# Patient Record
Sex: Female | Born: 1970 | Race: White | Hispanic: No | Marital: Married | Smoking: Never smoker
Health system: Southern US, Community
[De-identification: ages and names within clinical notes are randomized; demographics above are authoritative.]

## PROBLEM LIST (undated history)

## (undated) DIAGNOSIS — D649 Anemia, unspecified: Secondary | ICD-10-CM

## (undated) HISTORY — PX: WISDOM TOOTH EXTRACTION: SHX21

## (undated) HISTORY — DX: Anemia, unspecified: D64.9

---

## 2008-02-22 ENCOUNTER — Ambulatory Visit: Payer: Self-pay | Admitting: Obstetrics & Gynecology

## 2008-04-10 ENCOUNTER — Encounter: Payer: Self-pay | Admitting: Obstetrics & Gynecology

## 2008-04-10 ENCOUNTER — Ambulatory Visit: Payer: Self-pay | Admitting: Obstetrics & Gynecology

## 2008-05-21 ENCOUNTER — Ambulatory Visit: Payer: Self-pay | Admitting: Obstetrics & Gynecology

## 2011-01-26 NOTE — Assessment & Plan Note (Signed)
Melissa Shields, Melissa Shields                 ACCOUNT NO.:  [id number]   MEDICAL RECORD NO.:  [id number]          PATIENT TYPE:  POB   LOCATION:  [facility] at [place]         FACILITY:  [facility]   PHYSICIAN:  Allie Bossier, MD        DATE OF BIRTH:  1971/05/18   DATE OF SERVICE:                                  CLINIC NOTE   Melissa Shields is a 40 year old separated white gravida 0 who was a patient of mine  in [place] and is subsequently seeing me at the [facility].  She is now going to the [facility] due to its location (closer  to [place] where she lives).  She is here for her annual exam  today.  Her last Pap smear was approximately 2 years ago in [facility]  she has always had normal Pap smears.  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  She would like to be pregnant and in fact  done a day-3 [facility]; the results will be available soon.   PAST SURGICAL HISTORY:  Wisdom teeth extraction.   FAMILY HISTORY:  Negative for breast, GYN, and colon malignancies, but  positive for coronary artery disease.   No LATEX allergies.  Her only drug allergy is an allergy medicine that  she received as a child.   SOCIAL HISTORY:  Negative for tobacco, alcohol, or drug use.   REVIEW OF SYSTEMS:  Separated and abstinent for more than a year.  She  has only been able to achieve intercourse with her husband during their  marriage on very few occasions; this was done after extensive work with  vaginal dilators.  She works for the [employer]   MEDICATIONS:  She takes multivitamin (with folic acid) when she  remembers it (she notes that this is ideal prior to conception).   PHYSICAL EXAMINATION:  VITAL SIGNS:  Weight 117 pounds, height 5 feet 3-  1/2 inches, blood pressure 110/60, and pulse 68.  HEENT:  Normal.  HEART:  Regular rate and rhythm.  LUNGS:  Clear to auscultation bilaterally.  BREAST EXAM:  Normal bilaterally.  ABDOMEN:  Scaphoid.  No hepatosplenomegaly.  GU:  External genitalia, no lesions.  With great care, a Peterson  speculum was used to visualize the cervix.  It is nulliparous with no  lesions and normal discharge.  Pap smear with cultures was obtained.  Bimanual exam with one-finger has determined that her uterus is  anteverted and mobile and her adnexa are not enlarged.   ASSESSMENT AND PLAN:  1. Annual exam.  Checked a Pap smear.  She will get her routine annual      mammogram starting next year.  I have recommended self-breast and      self-vulvar exams monthly.  2. In her desire for pregnancy, I have recommended multivitamin with      folic acid daily.  I am awaiting on the day-3 [facility] results and she      will have to work out the paternity state of the future pregnancy.  3. Family history of  heart disease and no recent screening of lipids.      We will check her lipids today.  She is not fasting, but only had a      small bowl of cereal.      Allie Bossier, MD     MCD/MEDQ  D:  04/10/2008  T:  04/11/2008  Job:  [id number]

## 2011-01-26 NOTE — Assessment & Plan Note (Signed)
NAMEJULIENNE, Melissa Shields                 ACCOUNT NO.:  [id number]   MEDICAL RECORD NO.:  [id number]          PATIENT TYPE:  POB   LOCATION:  [facility]         FACILITY:  [facility]   PHYSICIAN:  Allie Bossier, MD        DATE OF BIRTH:  1971/03/21   DATE OF SERVICE:  02/22/2008                                  CLINIC NOTE   Melissa Shields is a 40 year old separated white gravida 0.  She was a patient of  mine in [place], [place], where I saw her for severe  vaginismus.  In 2007, we were working on helping her to be able to have  intercourse with her husband.  Over the course of several months using  vaginal dilators and Elavil, she was eventually able to have intercourse  one time, but since that time her husband has been experiencing some  intermittent difficulties with impotence, and even with a counseling,  they have been going through a separation since November.  She reports  having difficulty sleeping, her family doctor, Dr. Valerie Shields had given  her some sleeping aids, but he has left [place] and has moved back  to [place].  Her main complaint today is that of difficulty getting  sleep.  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  Her other complaint is that she is fearful on  her decreasing fertility, she has never been pregnant and does desire to  be pregnant at some point.  She would like an idea of what her fertility  is like at this point.  She is willing to see a Reproductive  Endocrinologist as necessary.   PAST MEDICAL HISTORY:  1. Seasonal allergies.  2. Vaginismus.   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  Her Pap smear and mammogram were  both normal in 2007.   FAMILY HISTORY:  Negative for breast, GYN, and colon malignancies.   SOCIAL HISTORY:  She reports rare alcohol use.  She denies tobacco or  drug use.  No latex allergies, no known drug  allergies.   PHYSICAL EXAM:  Weight 116 pounds, height 5 feet 3 inches, blood  pressure 133/70, and pulse 79.   Our discussion ended with me giving her prescription for Elavil 25 mg  p.o. at bedtime to be refilled as necessary and one-time prescription of  #40 Ativan at 1 mg to be used on a p.r.n. basis only.  She understands,  will not be given refills for this medicine.  She will schedule an  annual exam in the [place] office in 2-3 weeks and at that time,  will let me know how her sleeping is doing.  I have recommended  counseling.  With regards to her desire for pregnancy, I will check a  day-3 [facility] and have this blood test drawn in [facility].  She will go  over the results with me in the [place] office and I will refer her  to Dr. __________as necessary.     Allie Bossier, MD  MCD/MEDQ  D:  02/22/2008  T:  02/23/2008  Job:  [id number]

## 2014-03-13 ENCOUNTER — Telehealth: Payer: Self-pay | Admitting: *Deleted

## 2014-03-13 NOTE — Telephone Encounter (Signed)
Will route note to Dr. Marice Potterove for her recommendation. Patient not seen since 2009. She was a patient of Dr. Norman Clayoves from [place].

## 2014-03-13 NOTE — Telephone Encounter (Signed)
Patient left message that she is a patient of Dr. Norman Clayoves. She states that she is having pain in her breast and would like to be referred for a mammogram. She can be reached at [phone].

## 2014-03-21 ENCOUNTER — Telehealth: Payer: Self-pay | Admitting: General Practice

## 2014-03-21 NOTE — Telephone Encounter (Signed)
Patient called and left message stating she is a patient of Dr Ellin Sabaove's and wanted an appt for a mammogram but called and had one set up herself and it is scheduled for tomorrow 7/10 and she would like those results when they come back. Called patient and stated she may call us back on Monday for those results. Patient verbalized understanding and stated after that she would like to set up an appt at the [facility] for her annual exam. Gave patient [place] office phone number and told her to go ahead and call them to set up an appt. Patient verbalized understanding and had no other questions

## 2014-03-22 NOTE — Telephone Encounter (Signed)
Message copied by Mannie StabileASH, AMANDA A on Fri Mar 22, 2014 11:32 AM ------      Message from: Allie BossierVE, MYRA C      Created: Fri Mar 22, 2014 10:44 AM       Both please. She can be seen in [place] and have her mammogram there or at [facility] if this is more convenient.      Thanks      ----- Message -----         From: Faith RogueAmanda Andrews Rash, LPN         Sent: 03/20/2014  12:56 PM           To: Allie BossierMyra C Dove, MD            Dr. Marice Potterove,             This is a patient of yours from [facility]. She is requesting that you order a mammogram because she is having pain in her breast. You have not seen her since 2009. Do you want to schedule her for an annual exam or just a mammogram?            Thanks,             Marchelle FolksAmanda       ------

## 2014-03-22 NOTE — Telephone Encounter (Signed)
Will route note to [place] office.

## 2016-03-30 ENCOUNTER — Encounter: Payer: Self-pay | Admitting: Obstetrics & Gynecology

## 2016-04-20 ENCOUNTER — Encounter: Payer: Self-pay | Admitting: Obstetrics & Gynecology

## 2019-02-13 ENCOUNTER — Telehealth: Payer: Self-pay | Admitting: *Deleted

## 2019-02-13 NOTE — Telephone Encounter (Signed)
Left patient a message to call the office prior to appointment on 02/15/2019 to answer screening questions.

## 2019-02-15 ENCOUNTER — Other Ambulatory Visit: Payer: Self-pay

## 2019-02-15 ENCOUNTER — Encounter: Payer: Self-pay | Admitting: Obstetrics & Gynecology

## 2019-02-15 ENCOUNTER — Ambulatory Visit: Payer: BLUE CROSS/BLUE SHIELD | Admitting: Obstetrics & Gynecology

## 2019-02-15 VITALS — BP 108/62 | HR 92 | Resp 16 | Ht 63.0 in | Wt 135.0 lb

## 2019-02-15 DIAGNOSIS — Z1151 Encounter for screening for human papillomavirus (HPV): Secondary | ICD-10-CM | POA: Diagnosis not present

## 2019-02-15 DIAGNOSIS — Z01419 Encounter for gynecological examination (general) (routine) without abnormal findings: Secondary | ICD-10-CM | POA: Diagnosis not present

## 2019-02-15 DIAGNOSIS — N920 Excessive and frequent menstruation with regular cycle: Secondary | ICD-10-CM

## 2019-02-15 DIAGNOSIS — Z124 Encounter for screening for malignant neoplasm of cervix: Secondary | ICD-10-CM | POA: Diagnosis not present

## 2019-02-15 NOTE — Progress Notes (Signed)
Subjective:    Melissa Shields is a 48 y.o. divorced P0  female who presents for an annual exam. The patient has no complaints today. Her periods are heavy, she was diagnosed with anemia and is on iron. The patient is not currently sexually active. GYN screening history: last pap: was normal. The patient wears seatbelts: yes. The patient participates in regular exercise: yes. Has the patient ever been transfused or tattooed?: no. The patient reports that there is not domestic violence in her life.   Menstrual History: OB History    Gravida  0   Para  0   Term  0   Preterm  0   AB  0   Living  0     SAB  0   TAB  0   Ectopic  0   Multiple  0   Live Births  0           Menarche age: 30 Patient's last menstrual period was 02/04/2019.    The following portions of the patient's history were reviewed and updated as appropriate: allergies, current medications, past family history, past medical history, past social history, past surgical history and problem list.  Review of Systems Pertinent items are noted in HPI.   mammogram normal this year FH- no breast/gyn/colon cancer Working at [employer] in [place]  Objective:    BP 108/62   Pulse 92   Resp 16   Ht 5\' 3"  (1.6 m)   Wt 135 lb (61.2 kg)   LMP 02/04/2019   BMI 23.91 kg/m   General Appearance:    Alert, cooperative, no distress, appears stated age  Head:    Normocephalic, without obvious abnormality, atraumatic  Eyes:    PERRL, conjunctiva/corneas clear, EOM's intact, fundi    benign, both eyes  Ears:    Normal TM's and external ear canals, both ears  Nose:   Nares normal, septum midline, mucosa normal, no drainage    or sinus tenderness  Throat:   Lips, mucosa, and tongue normal; teeth and gums normal  Neck:   Supple, symmetrical, trachea midline, no adenopathy;    thyroid:  no enlargement/tenderness/nodules; no carotid   bruit or JVD  Back:     Symmetric, no curvature, ROM normal, no CVA tenderness  Lungs:      Clear to auscultation bilaterally, respirations unlabored  Chest Wall:    No tenderness or deformity   Heart:    Regular rate and rhythm, S1 and S2 normal, no murmur, rub   or gallop  Breast Exam:    No tenderness, masses, or nipple abnormality  Abdomen:     Soft, non-tender, bowel sounds active all four quadrants,    no masses, no organomegaly  Genitalia:    Normal female without lesion, discharge or tenderness, slightly atrophic, narrow vagina, starting a period today normal size and shape, anteverted, mobile, non-tender, normal adnexal exam      Extremities:   Extremities normal, atraumatic, no cyanosis or edema  Pulses:   2+ and symmetric all extremities  Skin:   Skin color, texture, turgor normal, no rashes or lesions  Lymph nodes:   Cervical, supraclavicular, and axillary nodes normal  Neurologic:   CNII-XII intact, normal strength, sensation and reflexes    throughout  .    Assessment:    Healthy female exam.   menorrhagia   Plan:     Pelvic ultrasound. Thin prep Pap smear. with cotesting

## 2019-02-16 ENCOUNTER — Ambulatory Visit: Payer: BLUE CROSS/BLUE SHIELD

## 2019-02-19 LAB — CYTOLOGY - PAP
Diagnosis: NEGATIVE
HPV: NOT DETECTED

## 2019-02-22 ENCOUNTER — Other Ambulatory Visit: Payer: BLUE CROSS/BLUE SHIELD

## 2019-02-22 ENCOUNTER — Other Ambulatory Visit: Payer: Self-pay

## 2019-02-22 ENCOUNTER — Ambulatory Visit: Payer: BLUE CROSS/BLUE SHIELD

## 2019-02-22 ENCOUNTER — Ambulatory Visit (INDEPENDENT_AMBULATORY_CARE_PROVIDER_SITE_OTHER): Payer: BLUE CROSS/BLUE SHIELD

## 2019-02-22 ENCOUNTER — Other Ambulatory Visit: Payer: Self-pay | Admitting: Obstetrics & Gynecology

## 2019-02-22 DIAGNOSIS — N920 Excessive and frequent menstruation with regular cycle: Secondary | ICD-10-CM | POA: Diagnosis not present

## 2019-03-01 ENCOUNTER — Other Ambulatory Visit: Payer: Self-pay | Admitting: Obstetrics & Gynecology

## 2019-03-01 ENCOUNTER — Encounter: Payer: Self-pay | Admitting: Obstetrics & Gynecology

## 2019-03-01 ENCOUNTER — Telehealth: Payer: Self-pay | Admitting: Obstetrics & Gynecology

## 2019-03-01 DIAGNOSIS — N938 Other specified abnormal uterine and vaginal bleeding: Secondary | ICD-10-CM

## 2019-03-01 NOTE — Telephone Encounter (Signed)
I told her that the u/s was normal. I have rec'd chiro versus pelvic PT for this pain. I have ordered CBC and TSH.

## 2019-03-07 ENCOUNTER — Other Ambulatory Visit: Payer: Self-pay | Admitting: *Deleted

## 2019-03-07 ENCOUNTER — Other Ambulatory Visit: Payer: BLUE CROSS/BLUE SHIELD | Admitting: *Deleted

## 2019-03-07 ENCOUNTER — Other Ambulatory Visit: Payer: Self-pay

## 2019-03-07 DIAGNOSIS — N938 Other specified abnormal uterine and vaginal bleeding: Secondary | ICD-10-CM

## 2019-03-07 NOTE — Progress Notes (Signed)
Pt here for lab only draw

## 2019-03-08 LAB — TSH: TSH: 0.799 u[IU]/mL (ref 0.450–4.500)

## 2019-03-08 LAB — CBC
Hematocrit: 42.1 % (ref 34.0–46.6)
Hemoglobin: 13.5 g/dL (ref 11.1–15.9)
MCH: 31 pg (ref 26.6–33.0)
MCHC: 32.1 g/dL (ref 31.5–35.7)
MCV: 97 fL (ref 79–97)
Platelets: 236 10*3/uL (ref 150–450)
RBC: 4.36 x10E6/uL (ref 3.77–5.28)
RDW: 13.5 % (ref 11.7–15.4)
WBC: 5.8 10*3/uL (ref 3.4–10.8)

## 2019-03-21 ENCOUNTER — Telehealth: Payer: Self-pay | Admitting: Obstetrics & Gynecology

## 2019-03-21 NOTE — Telephone Encounter (Signed)
I called to discuss her normal results. I left a vm and told her to call me tomorrow at the [place] office.

## 2019-04-27 ENCOUNTER — Telehealth: Payer: Self-pay | Admitting: Obstetrics & Gynecology

## 2019-04-27 NOTE — Telephone Encounter (Signed)
I returned her call, left voice mail. I rec'd that she make an appt.

## 2019-05-07 ENCOUNTER — Other Ambulatory Visit: Payer: Self-pay

## 2019-05-07 ENCOUNTER — Ambulatory Visit (INDEPENDENT_AMBULATORY_CARE_PROVIDER_SITE_OTHER): Payer: BLUE CROSS/BLUE SHIELD | Admitting: Obstetrics & Gynecology

## 2019-05-07 ENCOUNTER — Encounter: Payer: Self-pay | Admitting: Obstetrics & Gynecology

## 2019-05-07 VITALS — BP 122/66 | HR 91 | Ht 63.0 in | Wt 133.0 lb

## 2019-05-07 DIAGNOSIS — N920 Excessive and frequent menstruation with regular cycle: Secondary | ICD-10-CM

## 2019-05-07 MED ORDER — NORGESTREL-ETHINYL ESTRADIOL 0.3-30 MG-MCG PO TABS: 1.0000 | ORAL_TABLET | Freq: Every day | ORAL | 11 refills | Status: DC

## 2019-05-07 NOTE — Progress Notes (Signed)
   Subjective:    Patient ID: Melissa Shields, female    DOB: [birth year], 48 y.o.   MRN: [id number]  HPI 48 yo G0 here to discuss her heavy periods. The work up has been normal including an u/s, TSH, and CBC that showed a hbg of 13.5. Her endometrium was 5 mm. She tried OCPs in the past for about a year but she stopped because she became abstinent. Her periods were more manageable when she was on the OCPs.    Review of Systems Pap normal 6/20. Mammogram normal 5/20    Objective:   Physical Exam Breathing, conversing, and ambulating normally Well nourished, well hydrated White female, no apparent distress      Assessment & Plan:  Heavy long periods with normal work up Rec restart OCPs (lo ovral) with first day of NMP She will check her BP 2 weeks after she starts and will call if it is elevated. I told her that she does not need to take iron pills at this point.

## 2019-06-05 ENCOUNTER — Telehealth: Payer: Self-pay | Admitting: *Deleted

## 2019-06-05 MED ORDER — IBUPROFEN 600 MG PO TABS: 600.0000 mg | ORAL_TABLET | Freq: Four times a day (QID) | ORAL | 1 refills | Status: AC | PRN

## 2019-06-05 NOTE — Telephone Encounter (Signed)
Pt called requesting something for the uterine cramping she is experiencing.  Spoke with Dr Hulan Fray and Motrin 600 mg sent to [facility]

## 2019-07-03 ENCOUNTER — Encounter: Payer: Self-pay | Admitting: *Deleted

## 2020-06-30 ENCOUNTER — Telehealth: Payer: Self-pay | Admitting: *Deleted

## 2020-06-30 MED ORDER — NORGESTREL-ETHINYL ESTRADIOL 0.3-30 MG-MCG PO TABS: 1.0000 | ORAL_TABLET | Freq: Every day | ORAL | 1 refills | Status: AC

## 2020-06-30 NOTE — Telephone Encounter (Signed)
-----   Message from Acquanetta Sit, [place] sent at 06/30/2020  1:36 PM EDT ----- Regarding: FW: Birth Control Refill Hello from the front. If Dr. Earlene Plater has not done this already, can you please send [facility]. She is scheduled for 07/17/2020 with Dr. Earlene Plater. Thank you for your time. ----- Message ----- From: Conan Bowens, MD Sent: 06/30/2020   1:13 PM EDT To: Acquanetta Sit, NT Subject: RE: Birth Control Refill                       Can refill x 2 months but needs an appointment for long term management. Thanks. ----- Message ----- From: Acquanetta Sit, NT Sent: 06/25/2020   9:35 AM EDT To: Conan Bowens, MD Subject: Birth Control Refill                           Good Morning! This was a patient of Dr. Ellin Saba who is requesting a refill of the B.C, pills that Dr. Marice Potter put her on last year. I stated to the patient that she will need to make an appointment She asked that I send you a message first, to see if you would fill it without an appointment due to this being the same issue that she was having last year.

## 2020-07-17 ENCOUNTER — Ambulatory Visit: Payer: BLUE CROSS/BLUE SHIELD | Admitting: Obstetrics and Gynecology

## 2020-08-07 ENCOUNTER — Other Ambulatory Visit: Payer: Self-pay | Admitting: Obstetrics and Gynecology

## 2020-09-06 IMAGING — US US PELVIS COMPLETE
1 series · 14 of 25 positions shown · non-contrast
Comparison: None

CLINICAL DATA: Menorrhagia

EXAM:
TRANSABDOMINAL ULTRASOUND OF PELVIS
TECHNIQUE: Transabdominal ultrasound examination of the pelvis was performed
including evaluation of the uterus, ovaries, adnexal regions, and
pelvic cul-de-sac. Patient refused transvaginal imaging.

[Series 2: us pelvis complete · 0.27mm/px · 14 of 43 slices shown]
[im 1/43]
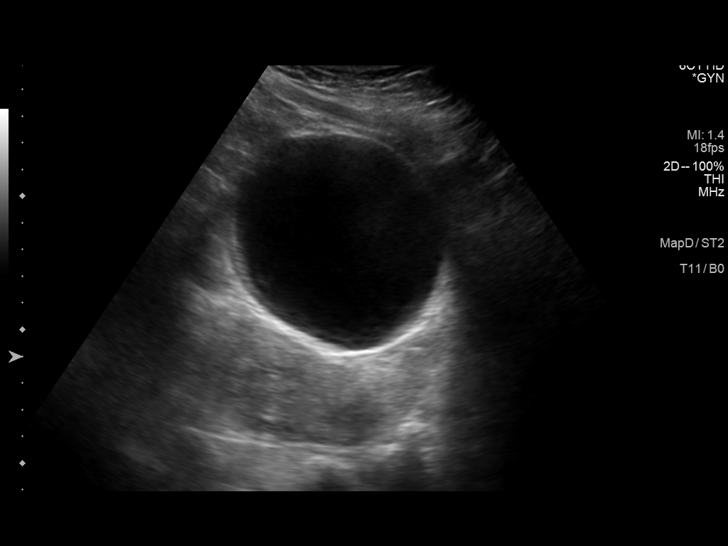
[im 4/43]
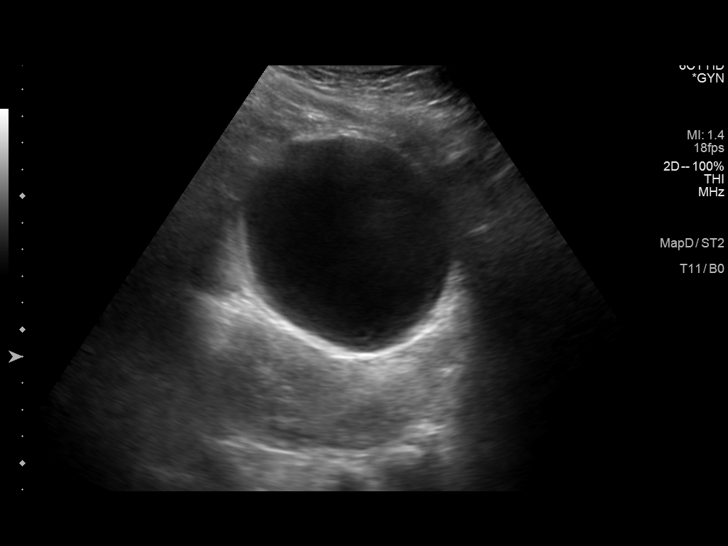
[im 8/43]
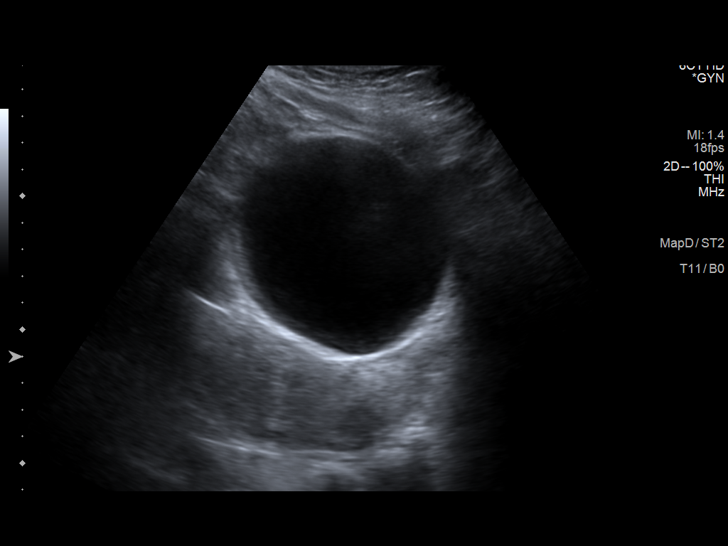
[im 11/43]
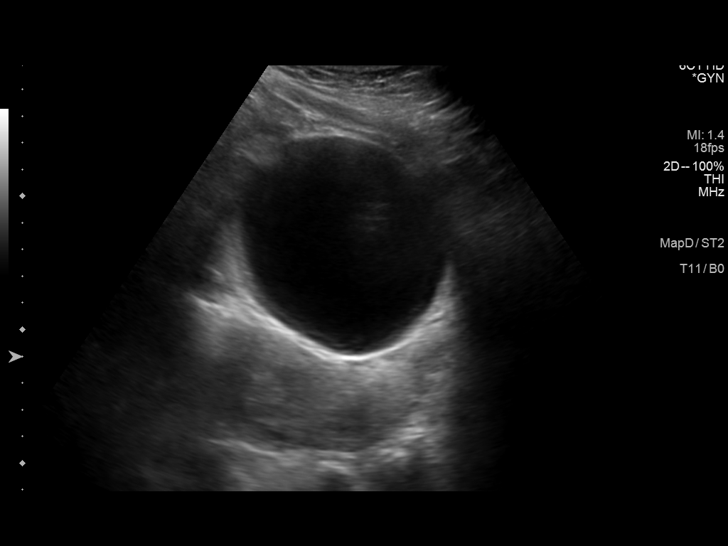
[im 15/43]
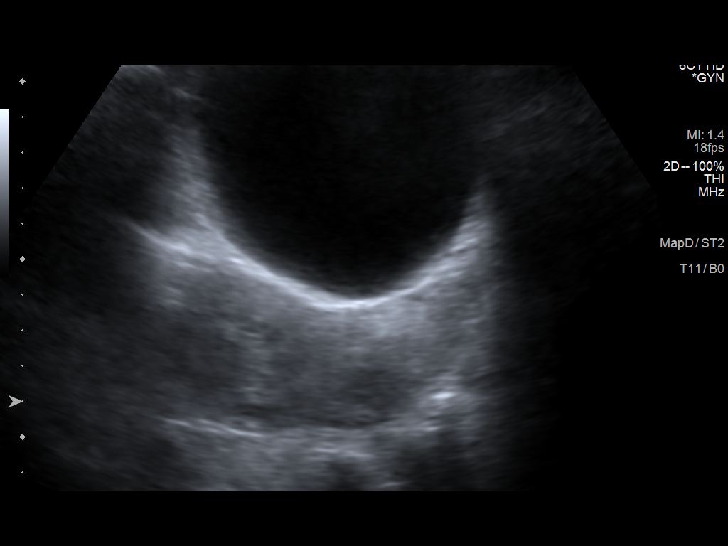
[im 16/43]
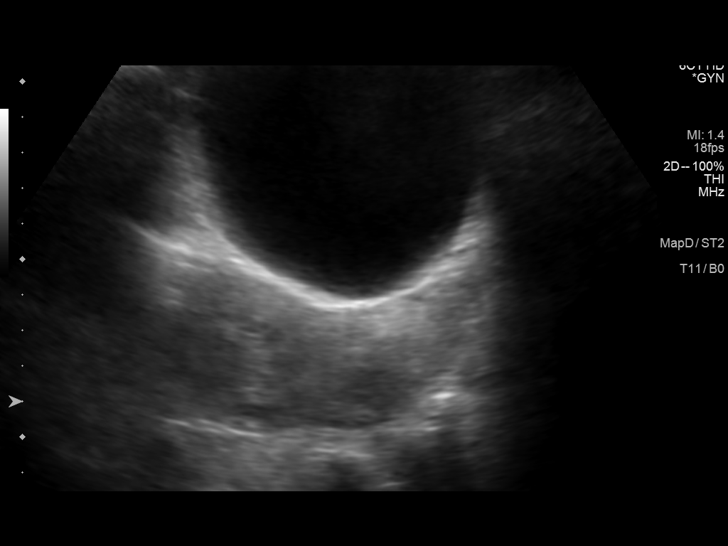
[im 20/43]
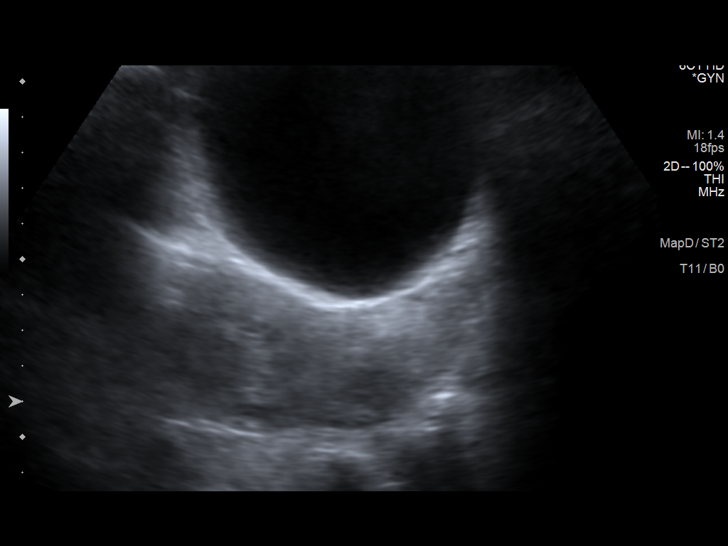
[im 23/43]
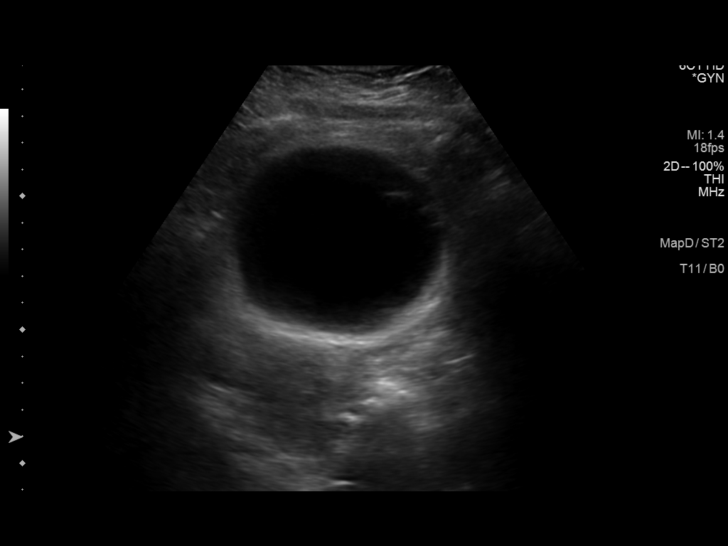
[im 27/43]
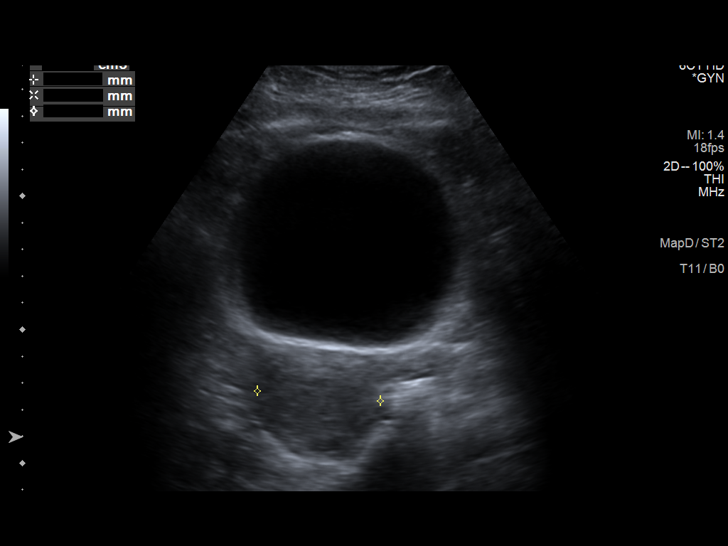
[im 29/43]
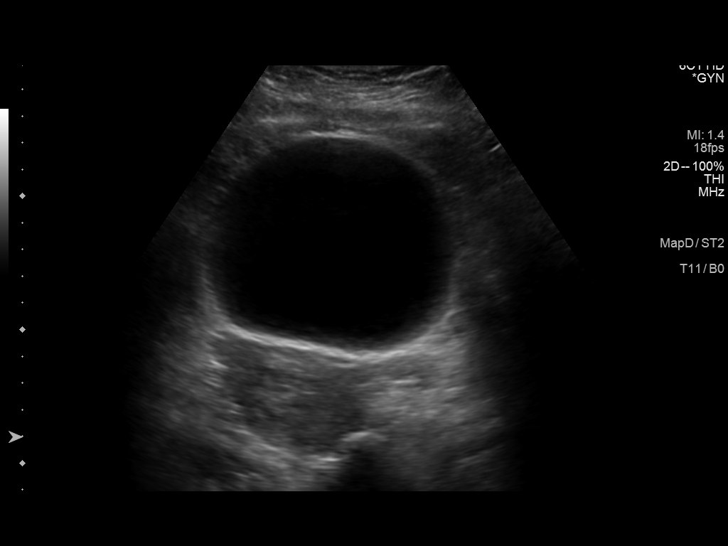
[im 32/43]
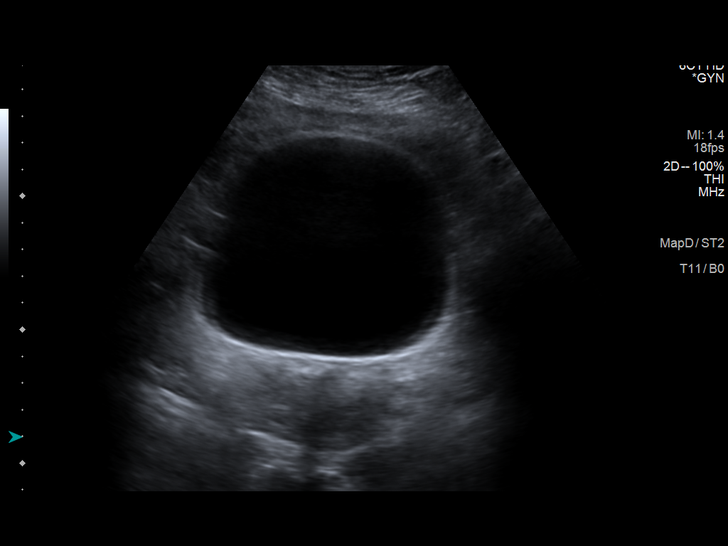
[im 36/43]
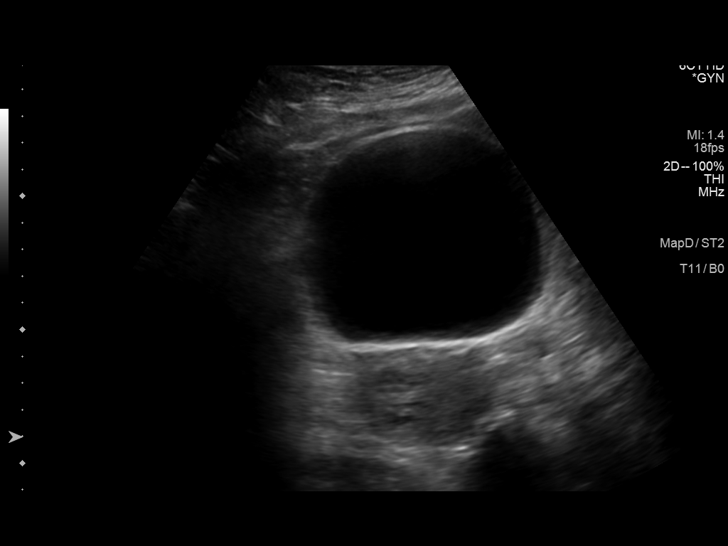
[im 39/43]
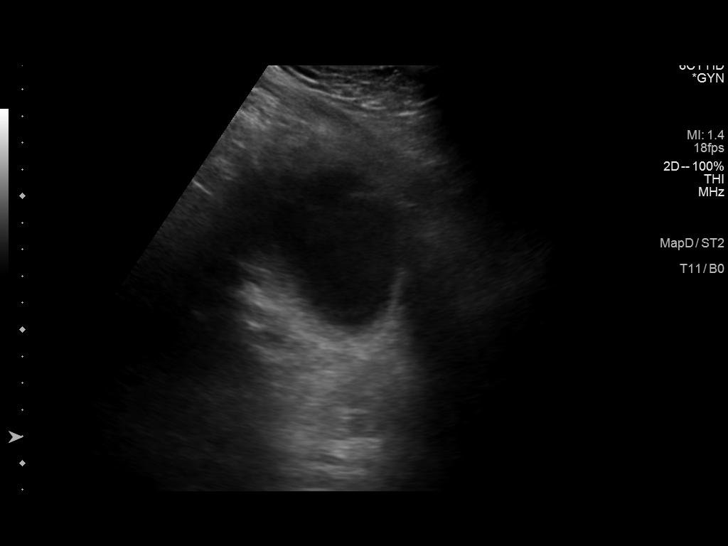
[im 43/43]
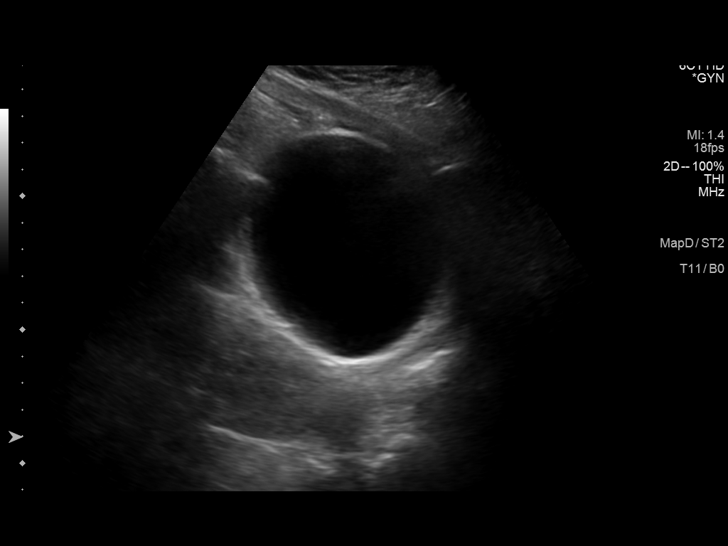

[14 of 25 positions shown; findings below may reference images not displayed]

FINDINGS: Uterus

Measurements: 7.8 x 4.2 x 4.6 cm = volume: 79 mL. Normal morphology
without mass

Endometrium

Thickness: 5 mm, normal. No endometrial fluid or focal abnormality
grossly identified on limited imaging.

Right ovary

Not visualized on either transabdominal or endovaginal imaging,
likely obscured by bowel

Left ovary

Not visualized on either transabdominal or endovaginal imaging,
likely obscured by bowel

Other findings: No free pelvic fluid. No adnexal masses. Bladder
well distended unremarkable.
IMPRESSION: Grossly normal appearing uterus and endometrial complex, exam
limited by lack of transvaginal imaging.

Nonvisualization of ovaries.

## 2021-10-21 LAB — COLOGUARD: COLOGUARD: NEGATIVE
